# Patient Record
Sex: Female | Born: 1975 | Hispanic: Yes | Marital: Married | Smoking: Never smoker
Health system: Southern US, Community
[De-identification: ages and names within clinical notes are randomized; demographics above are authoritative.]

## PROBLEM LIST (undated history)

## (undated) DIAGNOSIS — E559 Vitamin D deficiency, unspecified: Secondary | ICD-10-CM

## (undated) HISTORY — DX: Vitamin D deficiency, unspecified: E55.9

---

## 2019-09-15 DIAGNOSIS — R071 Chest pain on breathing: Secondary | ICD-10-CM | POA: Diagnosis not present

## 2019-11-24 ENCOUNTER — Encounter: Payer: Self-pay | Admitting: Family Medicine

## 2019-11-24 ENCOUNTER — Ambulatory Visit (INDEPENDENT_AMBULATORY_CARE_PROVIDER_SITE_OTHER): Payer: BC Managed Care – PPO | Admitting: Family Medicine

## 2019-11-24 ENCOUNTER — Other Ambulatory Visit: Payer: Self-pay

## 2019-11-24 VITALS — BP 100/80 | HR 88 | Ht 60.0 in | Wt 143.4 lb

## 2019-11-24 DIAGNOSIS — E559 Vitamin D deficiency, unspecified: Secondary | ICD-10-CM

## 2019-11-24 DIAGNOSIS — Z1231 Encounter for screening mammogram for malignant neoplasm of breast: Secondary | ICD-10-CM

## 2019-11-24 DIAGNOSIS — E663 Overweight: Secondary | ICD-10-CM

## 2019-11-24 DIAGNOSIS — Z114 Encounter for screening for human immunodeficiency virus [HIV]: Secondary | ICD-10-CM | POA: Diagnosis not present

## 2019-11-24 DIAGNOSIS — E785 Hyperlipidemia, unspecified: Secondary | ICD-10-CM

## 2019-11-24 DIAGNOSIS — Z1159 Encounter for screening for other viral diseases: Secondary | ICD-10-CM | POA: Diagnosis not present

## 2019-11-24 DIAGNOSIS — Z87898 Personal history of other specified conditions: Secondary | ICD-10-CM

## 2019-11-24 DIAGNOSIS — Z7689 Persons encountering health services in other specified circumstances: Secondary | ICD-10-CM

## 2019-11-24 DIAGNOSIS — Z862 Personal history of diseases of the blood and blood-forming organs and certain disorders involving the immune mechanism: Secondary | ICD-10-CM

## 2019-11-24 DIAGNOSIS — Z23 Encounter for immunization: Secondary | ICD-10-CM

## 2019-11-24 DIAGNOSIS — E1169 Type 2 diabetes mellitus with other specified complication: Secondary | ICD-10-CM

## 2019-11-24 NOTE — Assessment & Plan Note (Signed)
Reportedly benign, removed 3 years ago.  Receives every 6 month mammograms, placed mammogram order.  Additionally will obtain records from previous PCP for further information.

## 2019-11-24 NOTE — Assessment & Plan Note (Signed)
Suspected history of anemia as told by previous PCP to start ferrous sulfate and B12 supplements.  Will obtain confirmatory CBC, ferritin, B12 levels today and start medication as appropriate.

## 2019-11-24 NOTE — Assessment & Plan Note (Signed)
Reviewed past medical, surgical, and social history.  Medications updated in epic.  Health maintenance reviewed and obtained HIV/hepatitis C screening.  Given flu shot today.  Encouraged working towards a well-balanced diet and staying physically active.

## 2019-11-24 NOTE — Patient Instructions (Signed)
Wonderful to meet you! Labs should be back in the next few days.   I encourage to keep following your wonderful diet and trying to stay active outside and at [facility].

## 2019-11-24 NOTE — Assessment & Plan Note (Signed)
Check vitamin D level.  Encouraged safe sun exposure and will discuss supplementation pending value.

## 2019-11-24 NOTE — Progress Notes (Signed)
    SUBJECTIVE:   CHIEF COMPLAINT / HPI: New patient  [name] is a 44 year old female presenting to establish care and discuss the following:  She has no concerns today with the exception of having a mammogram order placed.  Due to the breast lump found to 3 years ago, she gets a mammogram every 6 months.  Last in 06/2019 and was normal.  Past medical history: -- PCP told her to take B12, vitamin D, and ferrous sulfate during her last visit but she is not sure why, she has not started taking the supplements --Right breast lump about 3 years ago, reports had it removed 3 years ago and was benign  Past surgical history:  --Breast biopsy 3 years ago, reported benign  --Tubal ligation (all deliveries vaginal) --Breast reduction approximately 1 year ago  Social history: Recently moved here from [place] to be closer to family in June.  Lives with her husband and 4 sons.  Non-smoker. 1-2 beers on the weekend. Just joined the [employer].  Tries to follow extremely well-balanced diet with plenty of vegetables.  Does not work.  Medications: Omega 3 supplement as she was told it was good for her cholesterol, would like this checked today  Past family history: Denies any specific family history of early coronary artery disease/MI, CVA, or early death.  No family history of breast or colon cancer.  Health maintenance: Hepatitis C screening, HIV screening, Tdap,  Flu shot.  Had her Pap smear this year through OB/GYN back at previous home, reports normal.  OBJECTIVE:   BP 100/80   Pulse 88   Ht 5' (1.524 m)   Wt 143 lb 6.4 oz (65 kg)   LMP 10/14/2019 (Exact Date)   SpO2 99%   BMI 28.01 kg/m   General: Alert, NAD HEENT: NCAT, MMM Cardiac: RRR no m/g/r Lungs: Clear bilaterally, no increased WOB  Abdomen: soft, non-tender Msk: Moves all extremities spontaneously  Ext: Warm, dry, 2+ distal pulses  ASSESSMENT/PLAN:   Establishing care with new doctor, encounter for Reviewed past medical,  surgical, and social history.  Medications updated in epic.  Health maintenance reviewed and obtained HIV/hepatitis C screening.  Given flu shot today.  Encouraged working towards a well-balanced diet and staying physically active.  Hx of breast lump Reportedly benign, removed 3 years ago.  Receives every 6 month mammograms, placed mammogram order.  Additionally will obtain records from previous PCP for further information.  History of anemia Suspected history of anemia as told by previous PCP to start ferrous sulfate and B12 supplements.  Will obtain confirmatory CBC, ferritin, B12 levels today and start medication as appropriate.  Vitamin D deficiency Check vitamin D level.  Encouraged safe sun exposure and will discuss supplementation pending value.    Follow-up pending results above.   Diana Stack, DO [facility]

## 2019-11-25 ENCOUNTER — Other Ambulatory Visit: Payer: Self-pay | Admitting: Family Medicine

## 2019-11-25 DIAGNOSIS — E559 Vitamin D deficiency, unspecified: Secondary | ICD-10-CM

## 2019-11-25 LAB — CBC WITH DIFFERENTIAL/PLATELET
Basophils Absolute: 0 10*3/uL (ref 0.0–0.2)
Basos: 1 %
EOS (ABSOLUTE): 0.1 10*3/uL (ref 0.0–0.4)
Eos: 1 %
Hematocrit: 37.7 % (ref 34.0–46.6)
Hemoglobin: 12.5 g/dL (ref 11.1–15.9)
Immature Grans (Abs): 0 10*3/uL (ref 0.0–0.1)
Immature Granulocytes: 0 %
Lymphocytes Absolute: 2 10*3/uL (ref 0.7–3.1)
Lymphs: 31 %
MCH: 31.6 pg (ref 26.6–33.0)
MCHC: 33.2 g/dL (ref 31.5–35.7)
MCV: 95 fL (ref 79–97)
Monocytes Absolute: 0.4 10*3/uL (ref 0.1–0.9)
Monocytes: 7 %
Neutrophils Absolute: 3.9 10*3/uL (ref 1.4–7.0)
Neutrophils: 60 %
Platelets: 347 10*3/uL (ref 150–450)
RBC: 3.96 x10E6/uL (ref 3.77–5.28)
RDW: 13.3 % (ref 11.7–15.4)
WBC: 6.4 10*3/uL (ref 3.4–10.8)

## 2019-11-25 LAB — FERRITIN: Ferritin: 32 ng/mL (ref 15–150)

## 2019-11-25 LAB — VITAMIN D 25 HYDROXY (VIT D DEFICIENCY, FRACTURES): Vit D, 25-Hydroxy: 25.5 ng/mL — ABNORMAL LOW (ref 30.0–100.0)

## 2019-11-25 LAB — VITAMIN B12: Vitamin B-12: 422 pg/mL (ref 232–1245)

## 2019-11-25 LAB — HCV INTERPRETATION

## 2019-11-25 LAB — LIPID PANEL
Chol/HDL Ratio: 6.3 ratio — ABNORMAL HIGH (ref 0.0–4.4)
Cholesterol, Total: 219 mg/dL — ABNORMAL HIGH (ref 100–199)
HDL: 35 mg/dL — ABNORMAL LOW (ref >39)
LDL Chol Calc (NIH): 149 mg/dL — ABNORMAL HIGH (ref 0–99)
Triglycerides: 194 mg/dL — ABNORMAL HIGH (ref 0–149)
VLDL Cholesterol Cal: 35 mg/dL (ref 5–40)

## 2019-11-25 LAB — HIV ANTIBODY (ROUTINE TESTING W REFLEX): HIV Screen 4th Generation wRfx: NONREACTIVE

## 2019-11-25 LAB — HCV AB W REFLEX TO QUANT PCR: HCV Ab: <0.1 s/co ratio (ref 0.0–0.9)

## 2019-11-25 MED ORDER — VITAMIN D3 20 MCG (800 UNIT) PO TABS: 1.0000 | ORAL_TABLET | Freq: Every day | ORAL | 1 refills | Status: AC

## 2019-11-29 ENCOUNTER — Other Ambulatory Visit: Payer: Self-pay

## 2019-11-29 ENCOUNTER — Other Ambulatory Visit: Payer: Self-pay | Admitting: Family Medicine

## 2019-11-29 ENCOUNTER — Ambulatory Visit
Admission: RE | Admit: 2019-11-29 | Discharge: 2019-11-29 | Disposition: A | Discharge status: Home / Self Care | Payer: BC Managed Care – PPO | Source: Ambulatory Visit | Attending: Family Medicine | Admitting: Family Medicine

## 2019-11-29 DIAGNOSIS — N63 Unspecified lump in unspecified breast: Secondary | ICD-10-CM

## 2019-11-29 DIAGNOSIS — Z1231 Encounter for screening mammogram for malignant neoplasm of breast: Secondary | ICD-10-CM

## 2019-12-13 ENCOUNTER — Other Ambulatory Visit: Payer: Self-pay | Admitting: Family Medicine

## 2019-12-13 DIAGNOSIS — N63 Unspecified lump in unspecified breast: Secondary | ICD-10-CM

## 2019-12-20 ENCOUNTER — Other Ambulatory Visit: Payer: Self-pay

## 2019-12-20 ENCOUNTER — Ambulatory Visit: Payer: BC Managed Care – PPO | Admitting: Student in an Organized Health Care Education/Training Program

## 2019-12-20 DIAGNOSIS — L700 Acne vulgaris: Secondary | ICD-10-CM | POA: Diagnosis not present

## 2019-12-20 MED ORDER — CLINDAMYCIN PHOSPHATE 1 % EX SOLN: Freq: Two times a day (BID) | CUTANEOUS | 1 refills | Status: DC

## 2019-12-20 NOTE — Progress Notes (Signed)
    SUBJECTIVE:   CHIEF COMPLAINT / HPI: mod-severe acne vulgaris  chronic acne- currently having a flare which is improving. Two days ago had very large superficial comedone which was red and painful and has now drained. Continues to have other smaller closed comedones which are still red and painful but not as severe. Saw dermatologist previously for this problem and was prescribed retin-A which helped. She has run out of this. Not using OTC treatments at this time. Denies fever.   OBJECTIVE:   BP 106/72   Pulse 73   Wt 139 lb 12.8 oz (63.4 kg)   SpO2 98%   BMI 27.30 kg/m   General: NAD, pleasant, able to participate in exam Extremities: no edema. WWP. Skin: warm and dry Positive for moderate to severe cystic acne worse in mask-wearing distribution. No acute abscess or cellulitis.  Neuro: alert and oriented, no focal deficits Psych: Normal affect and mood  ASSESSMENT/PLAN:   Acne vulgaris Topical clindamycin BID until acute flare has resolved.  Patient can use as prophylaxis or as treatment for flares. Can also use in combination with other OTC treatments such as benzoyl peroxide or salicylic acid. - follow up with dermatology as needed.     Leeroy Bock, DO [facility]

## 2019-12-20 NOTE — Patient Instructions (Signed)
It was a pleasure to see you today!  To summarize our discussion for this visit:  I have prescribed a topical antibiotic to treat your acne. You can use it for flare ups or daily to help prevent reoccurrence.   Use Vaseline on arms and legs after bathing to help keep skin moist.   Some additional health maintenance measures we should update are: Health Maintenance Due  Topic Date Due  . URINE MICROALBUMIN  Never done  . COVID-19 Vaccine (1) Never done  . TETANUS/TDAP  Never done  . PAP SMEAR-Modifier  Never done  .   Please return to our clinic to see me as needed.  Call the clinic at [phone] if your symptoms worsen or you have any concerns.   Thank you for allowing me to take part in your care,  Dr. Jamelle Rushing

## 2019-12-21 NOTE — Assessment & Plan Note (Signed)
Topical clindamycin BID until acute flare has resolved.  Patient can use as prophylaxis or as treatment for flares. Can also use in combination with other OTC treatments such as benzoyl peroxide or salicylic acid. - follow up with dermatology as needed.

## 2020-01-24 ENCOUNTER — Ambulatory Visit
Admission: RE | Admit: 2020-01-24 | Discharge: 2020-01-24 | Disposition: A | Discharge status: Home / Self Care | Payer: BC Managed Care – PPO | Source: Ambulatory Visit | Attending: Family Medicine | Admitting: Family Medicine

## 2020-01-24 ENCOUNTER — Other Ambulatory Visit: Payer: Self-pay

## 2020-01-24 ENCOUNTER — Other Ambulatory Visit: Payer: Self-pay | Admitting: Family Medicine

## 2020-01-24 DIAGNOSIS — N63 Unspecified lump in unspecified breast: Secondary | ICD-10-CM

## 2020-01-24 DIAGNOSIS — N6311 Unspecified lump in the right breast, upper outer quadrant: Secondary | ICD-10-CM | POA: Diagnosis not present

## 2020-01-24 DIAGNOSIS — N6322 Unspecified lump in the left breast, upper inner quadrant: Secondary | ICD-10-CM | POA: Diagnosis not present

## 2020-01-24 DIAGNOSIS — N6314 Unspecified lump in the right breast, lower inner quadrant: Secondary | ICD-10-CM | POA: Diagnosis not present

## 2020-01-24 DIAGNOSIS — N6312 Unspecified lump in the right breast, upper inner quadrant: Secondary | ICD-10-CM | POA: Diagnosis not present

## 2020-02-21 ENCOUNTER — Other Ambulatory Visit: Payer: Self-pay | Admitting: Student in an Organized Health Care Education/Training Program

## 2020-07-19 ENCOUNTER — Other Ambulatory Visit: Payer: Self-pay

## 2020-07-20 ENCOUNTER — Ambulatory Visit (INDEPENDENT_AMBULATORY_CARE_PROVIDER_SITE_OTHER): Payer: BC Managed Care – PPO | Admitting: Family Medicine

## 2020-07-20 ENCOUNTER — Encounter: Payer: Self-pay | Admitting: Family Medicine

## 2020-07-20 ENCOUNTER — Other Ambulatory Visit: Payer: Self-pay

## 2020-07-20 VITALS — BP 95/70 | HR 70 | Ht 60.0 in | Wt 138.8 lb

## 2020-07-20 DIAGNOSIS — Z23 Encounter for immunization: Secondary | ICD-10-CM | POA: Diagnosis not present

## 2020-07-20 DIAGNOSIS — Z131 Encounter for screening for diabetes mellitus: Secondary | ICD-10-CM | POA: Diagnosis not present

## 2020-07-20 DIAGNOSIS — E559 Vitamin D deficiency, unspecified: Secondary | ICD-10-CM | POA: Diagnosis not present

## 2020-07-20 DIAGNOSIS — E785 Hyperlipidemia, unspecified: Secondary | ICD-10-CM | POA: Diagnosis not present

## 2020-07-20 DIAGNOSIS — Z0000 Encounter for general adult medical examination without abnormal findings: Secondary | ICD-10-CM

## 2020-07-20 LAB — POCT GLYCOSYLATED HEMOGLOBIN (HGB A1C): Hemoglobin A1C: 5.2 % (ref 4.0–5.6)

## 2020-07-20 NOTE — Assessment & Plan Note (Addendum)
Reviewed past medical and social history.  Medications updated in epic.  Health maintenance reviewed and will obtain lipid panel/screening A1c.  Declines STD screening, low risk.  Administered Tdap today.  Tubal ligation for contraception.  Next mammogram on 6/14, history of nonmalignant breast lump.  Congratulated and encouraged to continue working towards a well-balanced diet and staying physically active.

## 2020-07-20 NOTE — Patient Instructions (Signed)
Wonderful to see you!   Keep up the good work with your diet and exercise. Trying adding some fast walking to your routine.   Labs should be back in a few days, I will call or send a letter with results.

## 2020-07-20 NOTE — Assessment & Plan Note (Signed)
Recheck vitamin D level.  Taking 800 U daily.

## 2020-07-20 NOTE — Progress Notes (Signed)
    SUBJECTIVE:   Chief compliant/HPI: annual examination  Diana Fletcher is a 45 y.o. who presents today for an annual exam.  She reports she is doing well and has no concerns today.  She is due for her Tdap vaccine.  Otherwise up-to-date on health maintenance, has upcoming mammogram on 6/14.  Pap smear normal 2 years ago.  Lifelong non-smoker.  Sexually active with husband, history of tubal ligation, does not desire STD screening.  Follows balanced diet and walks several days a week for at least 30 minutes.  No family history of colon cancer.  History tabs reviewed and updated yes.   Review of systems form reviewed and notable for none.    OBJECTIVE:   BP 95/70   Pulse 70   Ht 5' (1.524 m)   Wt 138 lb 12.8 oz (63 kg)   LMP 07/13/2020 (Exact Date)   SpO2 98%   BMI 27.11 kg/m    General: Alert, NAD HEENT: NCAT, MMM, thyroid non-enlarged  Cardiac: RRR no m/g/r Lungs: Clear bilaterally, no increased WOB  Abdomen: soft Msk: Moves all extremities spontaneously, normal gait   Ext: Warm, dry, 2+ distal pulses, no edema   Psych: Normal mood and affect   ASSESSMENT/PLAN:   Annual physical exam Reviewed past medical and social history.  Medications updated in epic.  Health maintenance reviewed and will obtain lipid panel/screening A1c.  Declines STD screening, low risk.  Administered Tdap today.  Tubal ligation for contraception.  Next mammogram on 6/14, history of nonmalignant breast lump.  Congratulated and encouraged to continue working towards a well-balanced diet and staying physically active.  Vitamin D deficiency Recheck vitamin D level.  Taking 800 U daily.    Follow up in 1 year or sooner if indicated.    Allayne Stack, DO [facility]

## 2020-07-21 LAB — LIPID PANEL
Chol/HDL Ratio: 5.6 ratio — ABNORMAL HIGH (ref 0.0–4.4)
Cholesterol, Total: 230 mg/dL — ABNORMAL HIGH (ref 100–199)
HDL: 41 mg/dL (ref >39)
LDL Chol Calc (NIH): 142 mg/dL — ABNORMAL HIGH (ref 0–99)
Triglycerides: 259 mg/dL — ABNORMAL HIGH (ref 0–149)
VLDL Cholesterol Cal: 47 mg/dL — ABNORMAL HIGH (ref 5–40)

## 2020-07-21 LAB — VITAMIN D 25 HYDROXY (VIT D DEFICIENCY, FRACTURES): Vit D, 25-Hydroxy: 27 ng/mL — ABNORMAL LOW (ref 30.0–100.0)

## 2020-07-23 ENCOUNTER — Other Ambulatory Visit: Payer: Self-pay | Admitting: Family Medicine

## 2020-07-23 DIAGNOSIS — N63 Unspecified lump in unspecified breast: Secondary | ICD-10-CM

## 2020-07-24 ENCOUNTER — Other Ambulatory Visit: Payer: Self-pay | Admitting: Family Medicine

## 2020-07-24 ENCOUNTER — Other Ambulatory Visit: Payer: Self-pay

## 2020-07-24 ENCOUNTER — Ambulatory Visit
Admission: RE | Admit: 2020-07-24 | Discharge: 2020-07-24 | Disposition: A | Discharge status: Home / Self Care | Payer: BC Managed Care – PPO | Source: Ambulatory Visit | Attending: Family Medicine | Admitting: Family Medicine

## 2020-07-24 DIAGNOSIS — N6311 Unspecified lump in the right breast, upper outer quadrant: Secondary | ICD-10-CM | POA: Diagnosis not present

## 2020-07-24 DIAGNOSIS — N63 Unspecified lump in unspecified breast: Secondary | ICD-10-CM

## 2020-07-24 DIAGNOSIS — R922 Inconclusive mammogram: Secondary | ICD-10-CM | POA: Diagnosis not present

## 2020-07-24 DIAGNOSIS — N6489 Other specified disorders of breast: Secondary | ICD-10-CM | POA: Diagnosis not present

## 2020-08-31 ENCOUNTER — Ambulatory Visit (HOSPITAL_COMMUNITY)
Admission: EM | Admit: 2020-08-31 | Discharge: 2020-08-31 | Disposition: A | Discharge status: Home / Self Care | Payer: BC Managed Care – PPO

## 2020-08-31 ENCOUNTER — Other Ambulatory Visit: Payer: Self-pay

## 2020-09-17 DIAGNOSIS — K5900 Constipation, unspecified: Secondary | ICD-10-CM | POA: Diagnosis not present

## 2020-09-17 DIAGNOSIS — R109 Unspecified abdominal pain: Secondary | ICD-10-CM | POA: Diagnosis not present

## 2020-09-17 DIAGNOSIS — N390 Urinary tract infection, site not specified: Secondary | ICD-10-CM | POA: Diagnosis not present

## 2021-06-17 ENCOUNTER — Other Ambulatory Visit: Payer: Self-pay | Admitting: Family Medicine

## 2021-06-17 DIAGNOSIS — N6320 Unspecified lump in the left breast, unspecified quadrant: Secondary | ICD-10-CM

## 2021-07-04 DIAGNOSIS — Z03818 Encounter for observation for suspected exposure to other biological agents ruled out: Secondary | ICD-10-CM | POA: Diagnosis not present

## 2021-07-04 DIAGNOSIS — Z20822 Contact with and (suspected) exposure to covid-19: Secondary | ICD-10-CM | POA: Diagnosis not present

## 2021-07-16 ENCOUNTER — Emergency Department (HOSPITAL_COMMUNITY): Payer: BC Managed Care – PPO

## 2021-07-16 ENCOUNTER — Emergency Department (HOSPITAL_COMMUNITY)
Admission: EM | Admit: 2021-07-16 | Discharge: 2021-07-16 | Disposition: A | Discharge status: Home / Self Care | Payer: BC Managed Care – PPO | Attending: Student | Admitting: Student

## 2021-07-16 ENCOUNTER — Encounter (HOSPITAL_COMMUNITY): Payer: Self-pay

## 2021-07-16 ENCOUNTER — Other Ambulatory Visit: Payer: Self-pay

## 2021-07-16 DIAGNOSIS — S43422A Sprain of left rotator cuff capsule, initial encounter: Secondary | ICD-10-CM | POA: Diagnosis not present

## 2021-07-16 DIAGNOSIS — M25512 Pain in left shoulder: Secondary | ICD-10-CM | POA: Diagnosis not present

## 2021-07-16 DIAGNOSIS — W109XXA Fall (on) (from) unspecified stairs and steps, initial encounter: Secondary | ICD-10-CM | POA: Insufficient documentation

## 2021-07-16 DIAGNOSIS — S4992XA Unspecified injury of left shoulder and upper arm, initial encounter: Secondary | ICD-10-CM | POA: Diagnosis not present

## 2021-07-16 DIAGNOSIS — S2242XA Multiple fractures of ribs, left side, initial encounter for closed fracture: Secondary | ICD-10-CM | POA: Diagnosis not present

## 2021-07-16 DIAGNOSIS — W19XXXA Unspecified fall, initial encounter: Secondary | ICD-10-CM

## 2021-07-16 MED ORDER — NAPROXEN 375 MG PO TABS: 375.0000 mg | ORAL_TABLET | Freq: Two times a day (BID) | ORAL | 0 refills | Status: DC

## 2021-07-16 MED ORDER — LIDOCAINE 5 % EX PTCH
1.0000 | MEDICATED_PATCH | CUTANEOUS | Status: DC
  Administered 2021-07-16: 1 via TRANSDERMAL
  Filled 2021-07-16: qty 1

## 2021-07-16 MED ORDER — NAPROXEN 250 MG PO TABS
500.0000 mg | ORAL_TABLET | Freq: Once | ORAL | Status: AC
  Administered 2021-07-16: 500 mg via ORAL
  Filled 2021-07-16: qty 2

## 2021-07-16 NOTE — ED Notes (Signed)
Left prior to vital signs.

## 2021-07-16 NOTE — ED Triage Notes (Signed)
Pt arrived POV from home c/o a fall last night. Pt states she was coming down the stairs and fell down about 3-4 stairs. Pt states her slippers were too slick and she slipped. Pt is c/o left shoulder pain and upper back pain on the left side.

## 2021-07-16 NOTE — ED Provider Notes (Signed)
[facility]  CSN: [id number] Arrival date & time: 07/16/21 1528  Chief Complaint(s) Fall  HPI Diana Fletcher is a [age] y.o. female who presents emergency department for evaluation of a fall.  Patient states that she slipped on the stairs fell onto her left shoulder.  No head strike or loss of consciousness.  She denies numbness, tingling, weakness of the upper or lower extremities.  Denies headache, nausea, vomiting or other systemic symptoms.  She endorses pain to the left shoulder and left scapula.  No external evidence of trauma on initial exam.   Past Medical History History reviewed. No pertinent past medical history. Patient Active Problem List   Diagnosis Date Noted   Annual physical exam 07/20/2020   Acne vulgaris 12/20/2019   Hx of breast lump 11/24/2019   History of anemia 11/24/2019   Vitamin D deficiency 11/24/2019   Home Medication(s) Prior to Admission medications   Medication Sig Start Date End Date Taking? Authorizing Provider  naproxen (NAPROSYN) 375 MG tablet Take 1 tablet (375 mg total) by mouth 2 (two) times daily. 07/16/21  Yes Devony Mcgrady, MD  Cholecalciferol (VITAMIN D3) 20 MCG (800 UNIT) TABS Take 1 tablet by mouth daily. 11/25/19   Allayne StackBeard, Samantha N, DO                                                                                                                                    Past Surgical History History reviewed. No pertinent surgical history. Family History History reviewed. No pertinent family history.  Social History   Allergies Patient has no known allergies.  Review of Systems Review of Systems  Musculoskeletal:  Positive for arthralgias and myalgias.   Physical Exam Vital Signs  I have reviewed the triage vital signs BP 111/84 (BP Location: Right Arm)   Pulse 91   Temp 98.3 F (36.8 C) (Oral)   Resp 16   Ht 5' (1.524 m)   Wt 62.6 kg   SpO2 98%   BMI 26.95 kg/m    Physical Exam Vitals and nursing note reviewed.  Constitutional:      General: She is not in acute distress.    Appearance: She is well-developed.  HENT:     Head: Normocephalic and atraumatic.  Eyes:     Conjunctiva/sclera: Conjunctivae normal.  Cardiovascular:     Rate and Rhythm: Normal rate and regular rhythm.     Heart sounds: No murmur heard. Pulmonary:     Effort: Pulmonary effort is normal. No respiratory distress.     Breath sounds: Normal breath sounds.  Abdominal:     Palpations: Abdomen is soft.     Tenderness: There is no abdominal tenderness.  Musculoskeletal:        General: Tenderness (Left scapula, left shoulder) present. No swelling.     Cervical back: Neck supple.  Skin:    General: Skin is warm  and dry.     Capillary Refill: Capillary refill takes less than 2 seconds.  Neurological:     Mental Status: She is alert.  Psychiatric:        Mood and Affect: Mood normal.    ED Results and Treatments Labs (all labs ordered are listed, but only abnormal results are displayed) Labs Reviewed - No data to display                                                                                                                        Radiology DG Shoulder Left  Result Date: 07/16/2021 CLINICAL DATA:  Fall with left-sided pain. EXAM: LEFT SHOULDER - 2+ VIEW COMPARISON:  None Available. FINDINGS: There is no evidence of fracture or dislocation. Minimal degenerative change of the acromioclavicular joint. Soft tissues are unremarkable. There are left anterior third through fifth rib fractures have callus formation and appear removed. IMPRESSION: No acute fracture or subluxation of the left shoulder. Electronically Signed   By: Narda Rutherford M.D.   On: 07/16/2021 17:25    Pertinent labs & imaging results that were available during my care of the patient were reviewed by me and considered in my medical decision making (see MDM for details).  Medications Ordered in  ED Medications  lidocaine (LIDODERM) 5 % 1 patch (1 patch Transdermal Patch Applied 07/16/21 1825)  naproxen (NAPROSYN) tablet 500 mg (500 mg Oral Given 07/16/21 1825)                                                                                                                                     Procedures Procedures  (including critical care time)  Medical Decision Making / ED Course   This patient presents to the ED for concern of fall, left shoulder pain, this involves an extensive number of treatment options, and is a complaint that carries with it a high risk of complications and morbidity.  The differential diagnosis includes fracture, ligamentous injury, rotator cuff tear, polytrauma  MDM: Patient seen emergency room for evaluation of left shoulder pain after fall.  Physical exam with tenderness at the left scapula and tenderness against resistance with a empty soda can test.  X-ray imaging negative.  Suspect rotator cuff injury.  Patient has full range of motion of neck with no midline C-spine tenderness and she does not meet [facility] head CT criteria for CT imaging of the  head or C-spine.  Patient placed in a sling and given Naprosyn and Lidoderm patches for pain control.  Patient given information to follow-up with [facility].  Patient then discharged.   Additional history obtained: -Additional history obtained from husband -External records from outside source obtained and reviewed including: Chart review including previous notes, labs, imaging, consultation notes  Imaging Studies ordered: I ordered imaging studies including Shoulder XR I independently visualized and interpreted imaging. I agree with the radiologist interpretation   Medicines ordered and prescription drug management: Meds ordered this encounter  Medications   naproxen (NAPROSYN) tablet 500 mg   lidocaine (LIDODERM) 5 % 1 patch   naproxen (NAPROSYN) 375 MG tablet    Sig: Take 1 tablet (375 mg total) by  mouth 2 (two) times daily.    Dispense:  20 tablet    Refill:  0    -I have reviewed the patients home medicines and have made adjustments as needed  Critical interventions none   Reevaluation: After the interventions noted above, I reevaluated the patient and found that they have :improved  Co morbidities that complicate the patient evaluation History reviewed. No pertinent past medical history.    Dispostion: I considered admission for this patient, with negative trauma imaging she is safe for discharge with outpatient follow-up.  She does not meet inpatient criteria for admission.     Final Clinical Impression(s) / ED Diagnoses Final diagnoses:  Fall, initial encounter  Sprain of left rotator cuff capsule, initial encounter     @PCDICTATION @    , MD 07/16/21 1907

## 2021-07-16 NOTE — ED Provider Triage Note (Signed)
Emergency Medicine Provider Triage Evaluation Note  Diana Fletcher , a 46 y.o. female  was evaluated in triage.  Pt complains of left shoulder pain after fall.  Patient states that she was walking in her slippers and slipped going down the stairs landing directly on her left shoulder around 10:30 PM last night.  Denies weakness, sensory deficits, dizziness/lightheadedness before the fall, fever, trauma to head, blood thinner use.  Review of Systems  Positive: See above Negative:   Physical Exam  BP 111/84 (BP Location: Right Arm)   Pulse 91   Temp 98.3 F (36.8 C) (Oral)   Resp 16   Ht 5' (1.524 m)   Wt 62.6 kg   SpO2 98%   BMI 26.95 kg/m  Gen:   Awake, no distress   Resp:  Normal effort  MSK:   Moves extremities without difficulty.  Patient has full passive range of motion of left shoulder.  Muscular tenderness upon palpation.  No tenderness to humeral head, clavicle, scapula.  No midline C-spine tenderness.  Radial pulses full and intact bilaterally.  Strength 5 out of 5. Other:    Medical Decision Making  Medically screening exam initiated at 4:34 PM.  Appropriate orders placed.  Diana Fletcher was informed that the remainder of the evaluation will be completed by another provider, this initial triage assessment does not replace that evaluation, and the importance of remaining in the ED until their evaluation is complete.     Wilnette Kales, [place] 07/16/21 [phone]

## 2021-08-03 NOTE — Progress Notes (Signed)
    SUBJECTIVE:   Chief compliant/HPI: annual examination  Diana Fletcher is a 46 y.o. female who presents today for an annual exam.   Current concerns: L shoulder pain- s/p fall 3 weeks ago. Was seen in ED and diagnosed with rotator cuff injury. Prescribed Naproxen but never picked it up. Still having pain and difficulty raising arm above 90 degrees, although symptoms are improved from initial injury.  History tabs reviewed and updated.  Exercises regularly- walks 30-40 minutes daily Watches her diet- tries to limit carbs   Review of systems form reviewed and no concerns noted.  OBJECTIVE:   BP 111/82   Pulse 99   Wt 139 lb (63 kg)   LMP 07/19/2021 (Approximate)   SpO2 99%   BMI 27.15 kg/m   Gen: NAD, pleasant, able to participate in exam HEENT: PERRLA, TM normal bilaterally, oropharynx unremarkable CV: RRR, normal S1/S2, no murmur Resp: Normal effort, lungs CTAB GI: abdomen soft, non-tender, non-distended Skin: warm and dry, no rashes noted Neuro: alert, no obvious focal deficits Psych: Normal affect and mood GU/GYN: Exam performed in the presence of a chaperone. External genitalia within normal limits.  Vaginal mucosa pink, moist, normal rugae.  Nonfriable cervix without lesions, no discharge or bleeding noted on speculum exam.  Bimanual exam revealed normal, nongravid uterus.  No cervical motion tenderness. No adnexal masses bilaterally.    Shoulder: Inspection reveals no obvious deformity, atrophy, or asymmetry. No bruising. No swelling Palpation is normal with no TTP over [facility] joint or bicipital groove. Active abduction limited to ~60 degrees secondary to pain Normal ROM in internal/external rotation NV intact distally Special Tests:  - Supraspinatous: Positive empty can.  5/5 strength with resisted flexion at 20 degrees - Infraspinatous/Teres Minor: 5/5 strength with ER - Subscapularis: negative belly press, negative bear hug. 5/5 strength with IR - Biceps  tendon: Negative Speeds - AC Joint: Negative cross arm   ASSESSMENT/PLAN:   Left Shoulder Pain S/p fall 3 weeks ago. Presentation consistent with rotator cuff strain. New Rx sent for Naproxen 500mg  BID. Rehab exercises given. If no improvement in 2-3 weeks, consider sports med eval and ultrasound.  Vit D Deficiency -Check Vit D level today -Continue daily supplementation   Annual Examination  See AVS for age appropriate recommendations.   PHQ score 1, reviewed and discussed.  Blood pressure reviewed and at goal.  Asked about intimate partner violence and resources given as appropriate  The patient currently uses tubal ligation for contraception.   Considered the following items based upon USPSTF recommendations: Diabetes screening: discussed Screening for elevated cholesterol: ordered HIV testing:  previously negative. Repeat not indicated Hepatitis C: previously negative. Repeat not indicated Syphilis if at high risk:  not at high risk GC/CT not at high risk and not ordered. Reviewed risk factors for latent tuberculosis and not indicated Reviewed risk factors for osteoporosis. Using FRAX tool estimated risk of major osteoporotic fracture of  2.1%, early screening not ordered  Cervical cancer screening: due for Pap today, cytology + HPV ordered Breast cancer screening:  had mammogram and ultrasound yesterday- showed stable probable benign masses in L breast (BI-RADS 3) Colorectal cancer screening: not applicable given age.   Follow up in 1 year or sooner if indicated.    , MD [facility]

## 2021-08-05 ENCOUNTER — Ambulatory Visit
Admission: RE | Admit: 2021-08-05 | Discharge: 2021-08-05 | Disposition: A | Discharge status: Home / Self Care | Payer: BC Managed Care – PPO | Source: Ambulatory Visit | Attending: Podiatry | Admitting: Podiatry

## 2021-08-05 DIAGNOSIS — N6322 Unspecified lump in the left breast, upper inner quadrant: Secondary | ICD-10-CM | POA: Diagnosis not present

## 2021-08-05 DIAGNOSIS — N6320 Unspecified lump in the left breast, unspecified quadrant: Secondary | ICD-10-CM

## 2021-08-05 DIAGNOSIS — R922 Inconclusive mammogram: Secondary | ICD-10-CM | POA: Diagnosis not present

## 2021-08-06 ENCOUNTER — Other Ambulatory Visit: Payer: Self-pay

## 2021-08-06 ENCOUNTER — Ambulatory Visit: Payer: BC Managed Care – PPO | Admitting: Family Medicine

## 2021-08-06 ENCOUNTER — Other Ambulatory Visit (HOSPITAL_COMMUNITY)
Admission: RE | Admit: 2021-08-06 | Discharge: 2021-08-06 | Disposition: A | Discharge status: Home / Self Care | Payer: BC Managed Care – PPO | Source: Ambulatory Visit | Attending: Family Medicine | Admitting: Family Medicine

## 2021-08-06 VITALS — BP 111/82 | HR 99 | Wt 139.0 lb

## 2021-08-06 DIAGNOSIS — E559 Vitamin D deficiency, unspecified: Secondary | ICD-10-CM

## 2021-08-06 DIAGNOSIS — E785 Hyperlipidemia, unspecified: Secondary | ICD-10-CM | POA: Diagnosis not present

## 2021-08-06 DIAGNOSIS — Z0000 Encounter for general adult medical examination without abnormal findings: Secondary | ICD-10-CM | POA: Diagnosis not present

## 2021-08-06 DIAGNOSIS — Z124 Encounter for screening for malignant neoplasm of cervix: Secondary | ICD-10-CM

## 2021-08-06 DIAGNOSIS — M7542 Impingement syndrome of left shoulder: Secondary | ICD-10-CM

## 2021-08-06 MED ORDER — NAPROXEN 500 MG PO TABS: 500.0000 mg | ORAL_TABLET | Freq: Two times a day (BID) | ORAL | 0 refills | Status: AC

## 2021-08-07 ENCOUNTER — Encounter: Payer: Self-pay | Admitting: Family Medicine

## 2021-08-07 DIAGNOSIS — E785 Hyperlipidemia, unspecified: Secondary | ICD-10-CM | POA: Insufficient documentation

## 2021-08-07 LAB — VITAMIN D 25 HYDROXY (VIT D DEFICIENCY, FRACTURES): Vit D, 25-Hydroxy: 29.1 ng/mL — ABNORMAL LOW (ref 30.0–100.0)

## 2021-08-07 LAB — LIPID PANEL
Chol/HDL Ratio: 5 ratio — ABNORMAL HIGH (ref 0.0–4.4)
Cholesterol, Total: 221 mg/dL — ABNORMAL HIGH (ref 100–199)
HDL: 44 mg/dL (ref >39)
LDL Chol Calc (NIH): 153 mg/dL — ABNORMAL HIGH (ref 0–99)
Triglycerides: 132 mg/dL (ref 0–149)
VLDL Cholesterol Cal: 24 mg/dL (ref 5–40)

## 2021-08-07 LAB — CYTOLOGY - PAP
Adequacy: ABSENT
Comment: NEGATIVE
Diagnosis: NEGATIVE
High risk HPV: NEGATIVE

## 2021-08-09 ENCOUNTER — Telehealth: Payer: Self-pay

## 2021-08-09 NOTE — Telephone Encounter (Signed)
Returned call to discuss results from recent visit. Normal pap- repeat due in 5 years. Vit D slightly low- continue daily supplementation (1000 IU Vit D). Cholesterol high but no indication for statin- advised lifestyle modifications. All questions answered.  Maury Dus, MD PGY-2, [facility]

## 2021-08-09 NOTE — Telephone Encounter (Signed)
Patient calls nurse line requesting lab results.   Will forward to PCP.  

## 2021-08-14 ENCOUNTER — Ambulatory Visit
Admission: RE | Admit: 2021-08-14 | Discharge: 2021-08-14 | Disposition: A | Discharge status: Home / Self Care | Payer: BC Managed Care – PPO | Source: Ambulatory Visit | Attending: Family Medicine | Admitting: Family Medicine

## 2021-08-14 ENCOUNTER — Ambulatory Visit: Payer: Self-pay

## 2021-08-14 ENCOUNTER — Ambulatory Visit: Payer: BC Managed Care – PPO | Admitting: Family Medicine

## 2021-08-14 VITALS — BP 86/50 | Ht 60.0 in | Wt 140.0 lb

## 2021-08-14 DIAGNOSIS — M25512 Pain in left shoulder: Secondary | ICD-10-CM

## 2021-08-14 NOTE — Patient Instructions (Signed)
Get repeat x-rays after you leave today. If these look normal I would recommend an MRI of your shoulder to further characterize the injury to the bone, assess the rotator cuff more clearly. Stop the home exercises for now. Icing 15 minutes at a time 3-4 times a day. Tylenol, ibuprofen if needed.

## 2021-08-15 ENCOUNTER — Encounter: Payer: Self-pay | Admitting: Family Medicine

## 2021-08-15 NOTE — Progress Notes (Signed)
PCP: Maury Dus, MD  Subjective:   HPI: Patient is a 46 y.o. female here for left shoulder pain.  Patient reports on 6/5 she was coming down the stairs quickly and fell injuring her left shoulder. Unsure exactly how she fell. Did not pass out or have prodromal symptoms. Has been doing home exercises with theraband. + night pain. No prior injuries to this shoulder. She is right handed. Radiographs were negative.  Past Medical History:  Diagnosis Date   Vitamin D deficiency     Current Outpatient Medications on File Prior to Visit  Medication Sig Dispense Refill   Cholecalciferol (VITAMIN D3) 20 MCG (800 UNIT) TABS Take 1 tablet by mouth daily. 90 tablet 1   naproxen (NAPROSYN) 500 MG tablet Take 1 tablet (500 mg total) by mouth 2 (two) times daily with a meal. 30 tablet 0   No current facility-administered medications on file prior to visit.    History reviewed. No pertinent surgical history.  No Known Allergies  BP (!) 86/50   Ht 5' (1.524 m)   Wt 140 lb (63.5 kg)   LMP 07/19/2021 (Approximate)   BMI 27.34 kg/m       No data to display              No data to display              Objective:  Physical Exam:  Gen: NAD, comfortable in exam room  Left shoulder: No swelling, ecchymoses.  No gross deformity. No TTP AC joint, biceps tendon. Full IR and ER.  Abduction and flexion limited to 90 degrees. Positive Hawkins, Neers. Negative Yergasons. Strength 4/5 with empty can and 5/5 resisted internal/external rotation. Negative apprehension. NV intact distally.   Complete MSK u/s left shoulder: Biceps tendon: intact on long and short views Pec major tendon: intact Subscapularis: intact without abnormalities AC joint: minimal effusion.  No other abnormalities Infraspinatus: intact without tear Supraspinatus: intact with mild overlying bursitis.  Cortical irregularity in two portions of greater tuberosity concerning for nondisplaced  fracture Posterior glenohumeral joint: no effusion.  Visible labrum intact.  No paralabral cyst  Impression: Probable nondisplaced fracture of greater tuberosity.  Rotator cuff intact.  Assessment & Plan:  1. Left shoulder injury - 2/2 fall.  1 month out from injury.  Ultrasound concerning for nondisplaced greater tuberosity fracture while rotator cuff is intact.  Will repeat radiographs to assess.  Consider MRI if those are negative.  Advised to stop strengthening exercises.

## 2021-09-03 ENCOUNTER — Ambulatory Visit: Payer: BC Managed Care – PPO | Admitting: Family Medicine

## 2021-09-03 DIAGNOSIS — L731 Pseudofolliculitis barbae: Secondary | ICD-10-CM | POA: Diagnosis not present

## 2021-09-03 NOTE — Patient Instructions (Signed)
Thank you for coming to see me today. It was a pleasure. Today we discussed your lump, it is small and I do not think it is anything dangerous, probably an in growing hair. I recommend: avoid shaving and waxing, trying laser, warm compresses, exfoliate with area   Please follow-up with [place] if it gets larger, fevers, drainage/bleeding from the area   If you have any questions or concerns, please do not hesitate to call the office at [phone].  Best wishes,   Dr Allena Katz

## 2021-09-03 NOTE — Assessment & Plan Note (Addendum)
Lump is likely an ingrown hair. It is too small to be an abscess, in addition no erythema, drainage or bleeding.  Low suspicion for lymphadenopathy. Recommended: -Avoid shaving and waxing -Patient can try laser hair removal for permanent removal of hair -Warm compresses multiple times a day -Exfoliating and moisturizing armpit area  -If increases in size then patient should follow-up with PCP

## 2021-09-03 NOTE — Progress Notes (Signed)
     SUBJECTIVE:   CHIEF COMPLAINT / HPI:   Diana Fletcher is a 46 y.o. female presents for follow-up  Left axillary lump Patient noticed extremely lump several months ago. She felt it when she was shaving/waxing at home. It is not getting bigger.  It hurts a little bit. Denies drainage, bleeding, fevers, night sweats, weight loss, etc. Denies breast lumps or nipple changes.    Flowsheet Row Office Visit from 08/06/2021 in [facility]  PHQ-9 Total Score 1         PERTINENT  PMH / PSH: Vitamin D deficiency, hyperlipidemia  OBJECTIVE:   BP 101/74   Pulse 82   Wt 137 lb 9.6 oz (62.4 kg)   LMP 07/19/2021 (Approximate)   SpO2 100%   BMI 26.87 kg/m    General: Alert, no acute distress Cardio: Well-perfused Pulm:  normal work of breathing Axilla: Deep, firm left-sided axilla lump, approximately 0.5 cm, mild tenderness to palpation, no overlying erythema, no drainage Neuro: Cranial nerves grossly intact   ASSESSMENT/PLAN:   Ingrown hair Lump is likely an ingrown hair. It is too small to be an abscess, in addition no erythema, drainage or bleeding.  Low suspicion for lymphadenopathy. Recommended: -Avoid shaving and waxing -Patient can try laser hair removal for permanent removal of hair -Warm compresses multiple times a day -Exfoliating and moisturizing armpit area  -If increases in size then patient should follow-up with PCP    Towanda Octave, MD PGY-3 [facility]

## 2022-09-03 ENCOUNTER — Ambulatory Visit (INDEPENDENT_AMBULATORY_CARE_PROVIDER_SITE_OTHER): Payer: BC Managed Care – PPO | Admitting: Family Medicine

## 2022-09-03 ENCOUNTER — Encounter: Payer: Self-pay | Admitting: Family Medicine

## 2022-09-03 VITALS — BP 92/68 | HR 78 | Ht 60.0 in | Wt 135.0 lb

## 2022-09-03 DIAGNOSIS — E782 Mixed hyperlipidemia: Secondary | ICD-10-CM | POA: Diagnosis not present

## 2022-09-03 DIAGNOSIS — Z87898 Personal history of other specified conditions: Secondary | ICD-10-CM | POA: Diagnosis not present

## 2022-09-03 DIAGNOSIS — Z0000 Encounter for general adult medical examination without abnormal findings: Secondary | ICD-10-CM

## 2022-09-03 NOTE — Patient Instructions (Addendum)
It was wonderful to see you today.  Today we talked about:  Your general health.  Overall you are very healthy and should continue with your daily routines.  We discussed briefly healthy diet and exercise including making sure you are not getting fruits and vegetables with every meal.  We will do a lipid panel today to follow your high cholesterol but overall you are at low risk for any heart problems at this time.  We also discussed that you can stop taking your vitamin D supplement as that was intended to be a short-term supplement.  Colon cancer screening.  As discussed you have several options for colon cancer screening moving forward: Additional colonoscopy, Cologuard, and FIT testing.  A few days to look these up and decide which one you think will be the most beneficial for you, then give [place] a call so that we can order the appropriate test.  Breast mass follow up.  I have put in the order for your follow-up mammogram and left breast ultrasound.  You can call the number on the form that you were given today to schedule an appointment for that whenever it is most convenient for you.  Thank you for choosing [facility].   Please call [phone] with any questions about today's appointment.  Please arrive at least 15 minutes prior to your scheduled appointments.   If you had blood work today, I will send you a MyChart message or a letter if results are normal. Otherwise, I will give you a call.   If you had a referral placed, they will call you to set up an appointment. Please give [place] a call if you don't hear back in the next 2 weeks.   If you need additional refills before your next appointment, please call your pharmacy first.   Gerrit Heck, DO Family Medicine

## 2022-09-03 NOTE — Assessment & Plan Note (Signed)
Scheduled patient for annual diagnostic mammogram with left breast ultrasound for follow-up on previously seen left breast mass BI-RADS 3.  After discussion, patient would like to think about options for colonoscopy before scheduling.  He was advised that all she would need to do once she made her decision was give the office a call and we would send in the order.  We also ordered a lipid panel and to continue following her high cholesterol.  She is low risk for cardiac events with an ASCVD score of less than 1%, statin therapy is not recommended at this time.  Discussed healthy diet and exercise. Patient may follow-up as needed

## 2022-09-03 NOTE — Progress Notes (Signed)
    SUBJECTIVE:   CHIEF COMPLAINT / HPI:   47 yo female here for general wellness exam.  No current concerns.  Has hyperlipidemia, needs lipid panel. Risk for ASCVD based on last labs is 0.9%. PAP done 07/2021, was negative, not due again until 2026 at earliest.  Due for follow up mammogram and ultrasound of stable left breast mass, BIRADS 3  Due for colonoscopy. Discussed colonoscopy options, would like to have some time to think about what she would like to do.   PERTINENT  PMH / PSH: None  OBJECTIVE:   BP 92/68   Pulse 78   Ht 5' (1.524 m)   Wt 135 lb (61.2 kg)   LMP 07/21/2022   SpO2 99%   BMI 26.37 kg/m   General: A&O, NAD HEENT: No sign of trauma, EOM grossly intact Cardiac: RRR, no m/r/g Respiratory: CTAB, normal WOB, no w/c/r Extremities: NTTP, no peripheral edema. Neuro: Normal gait, moves all four extremities appropriately. Psych: Appropriate mood and affect   ASSESSMENT/PLAN:   Annual physical exam Scheduled patient for annual diagnostic mammogram with left breast ultrasound for follow-up on previously seen left breast mass BI-RADS 3.  After discussion, patient would like to think about options for colonoscopy before scheduling.  He was advised that all she would need to do once she made her decision was give the office a call and we would send in the order.  We also ordered a lipid panel and to continue following her high cholesterol.  She is low risk for cardiac events with an ASCVD score of less than 1%, statin therapy is not recommended at this time.  Discussed healthy diet and exercise. Patient may follow-up as needed     Gerrit Heck, DO [facility]

## 2022-09-04 ENCOUNTER — Encounter: Payer: Self-pay | Admitting: Family Medicine

## 2022-09-04 LAB — LIPID PANEL
Chol/HDL Ratio: 5.4 ratio — ABNORMAL HIGH (ref 0.0–4.4)
Cholesterol, Total: 258 mg/dL — ABNORMAL HIGH (ref 100–199)
HDL: 48 mg/dL (ref >39)
LDL Chol Calc (NIH): 173 mg/dL — ABNORMAL HIGH (ref 0–99)
Triglycerides: 198 mg/dL — ABNORMAL HIGH (ref 0–149)
VLDL Cholesterol Cal: 37 mg/dL (ref 5–40)

## 2022-09-10 ENCOUNTER — Ambulatory Visit
Admission: RE | Admit: 2022-09-10 | Discharge: 2022-09-10 | Disposition: A | Discharge status: Home / Self Care | Payer: BC Managed Care – PPO | Source: Ambulatory Visit | Attending: Family Medicine | Admitting: Family Medicine

## 2022-09-10 DIAGNOSIS — Z87898 Personal history of other specified conditions: Secondary | ICD-10-CM

## 2022-09-10 DIAGNOSIS — N6322 Unspecified lump in the left breast, upper inner quadrant: Secondary | ICD-10-CM | POA: Diagnosis not present

## 2023-03-09 IMAGING — CR DG SHOULDER 2+V*L*
4 series · 4 of 4 positions shown · non-contrast
Comparison: None Available.

CLINICAL DATA: Fall with left-sided pain.

EXAM:
LEFT SHOULDER - 2+ VIEW

[shoulder grashey]
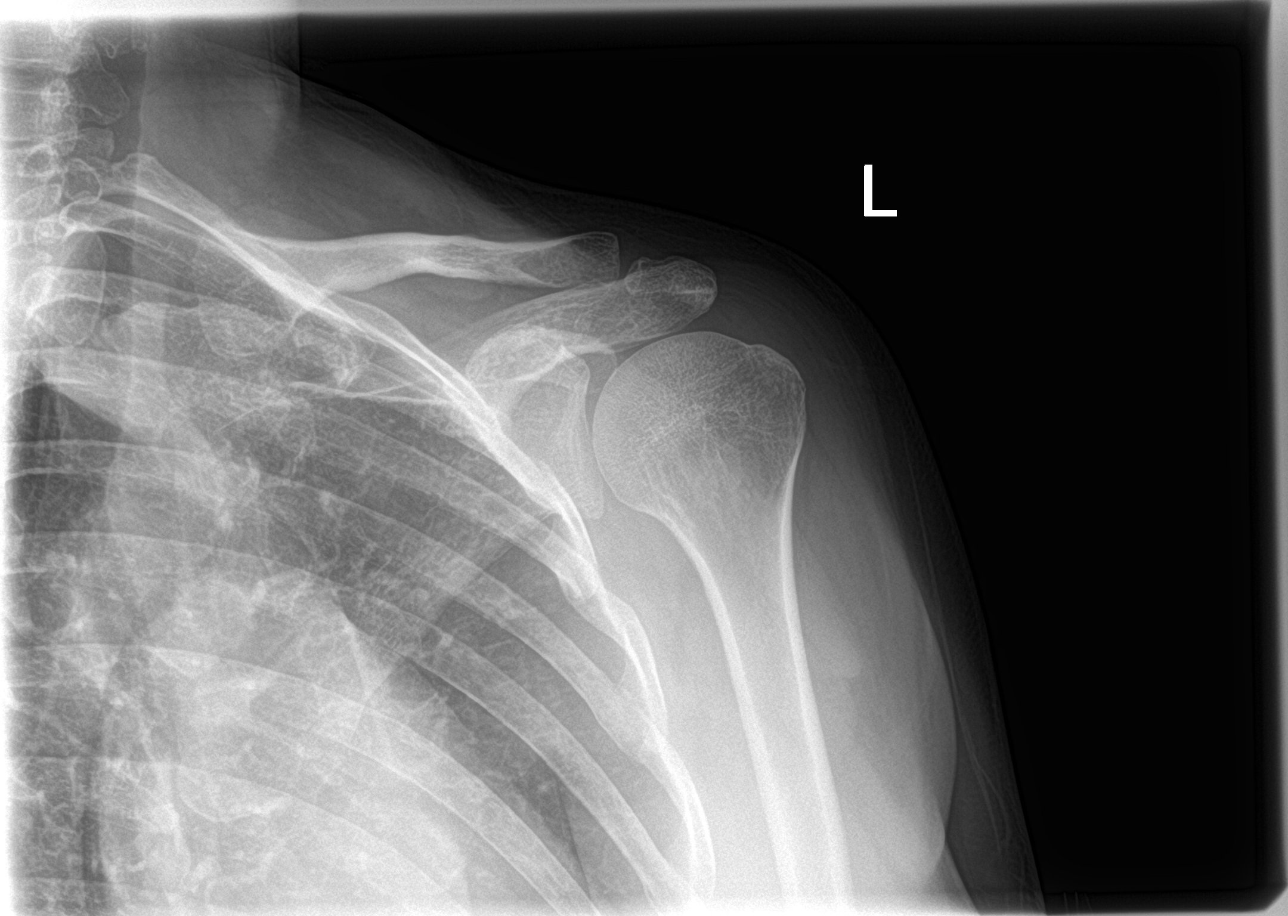

[shoulder y view]
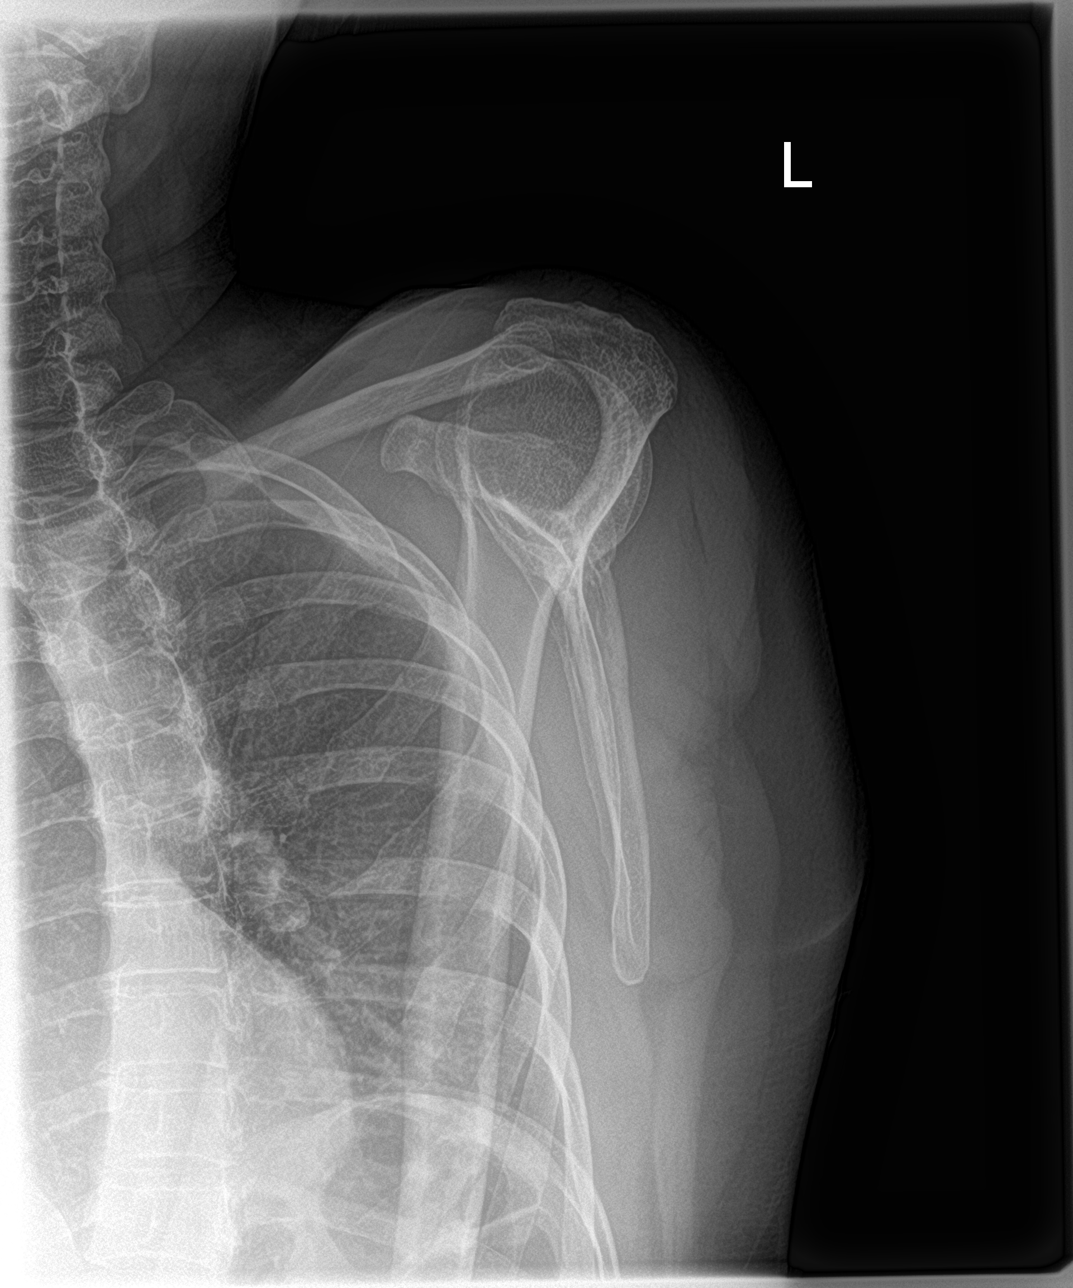

[shoulder axillary]
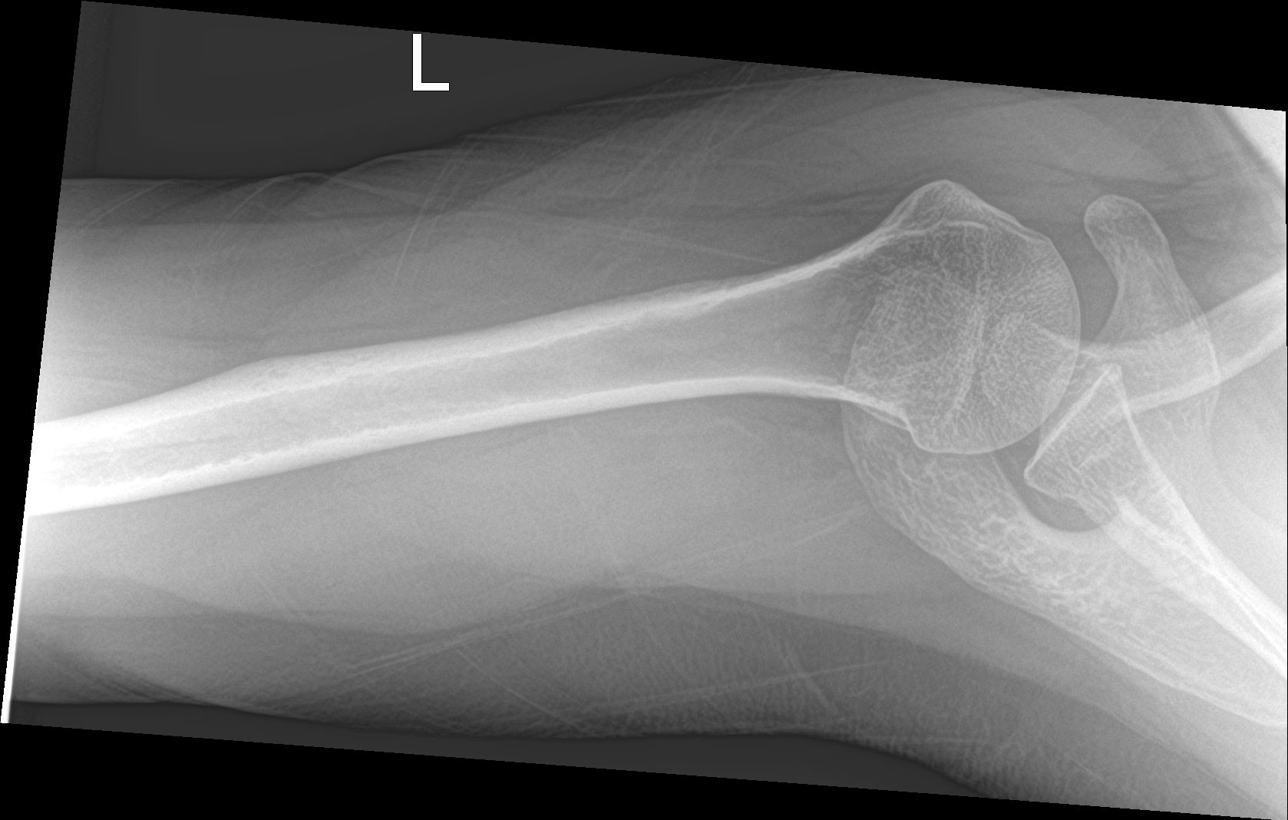

[shoulder ap neutral]
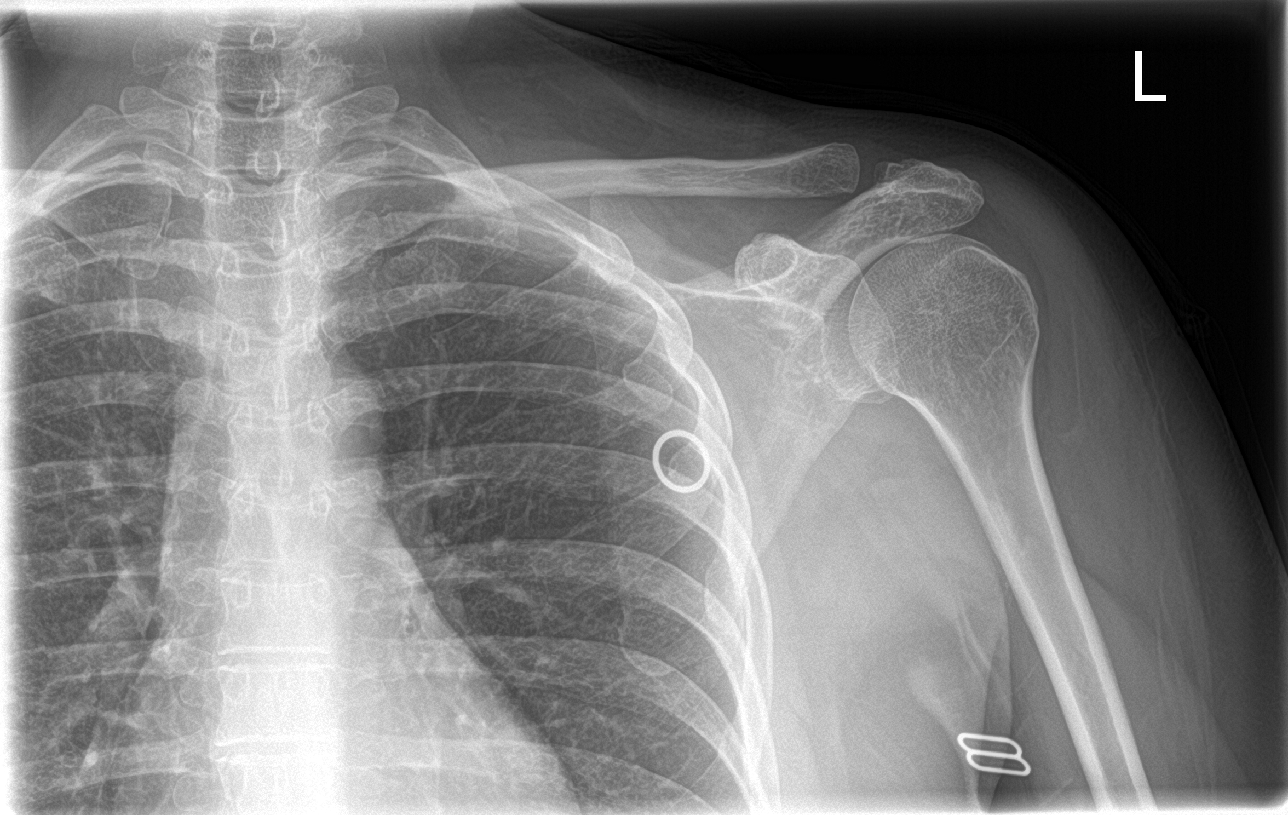

[4 of 4 positions shown; findings below may reference images not displayed]

FINDINGS: There is no evidence of fracture or dislocation. Minimal
degenerative change of the acromioclavicular joint. Soft tissues are
unremarkable. There are left anterior third through fifth rib
fractures have callus formation and appear removed.
IMPRESSION: No acute fracture or subluxation of the left shoulder.

## 2023-08-03 ENCOUNTER — Other Ambulatory Visit: Payer: Self-pay | Admitting: Family Medicine

## 2023-08-03 DIAGNOSIS — Z1231 Encounter for screening mammogram for malignant neoplasm of breast: Secondary | ICD-10-CM

## 2023-09-11 ENCOUNTER — Ambulatory Visit

## 2023-11-10 ENCOUNTER — Ambulatory Visit (INDEPENDENT_AMBULATORY_CARE_PROVIDER_SITE_OTHER): Admitting: Family Medicine

## 2023-11-10 ENCOUNTER — Encounter: Payer: Self-pay | Admitting: Family Medicine

## 2023-11-10 VITALS — BP 92/78 | HR 70 | Ht 60.0 in | Wt 137.5 lb

## 2023-11-10 DIAGNOSIS — L708 Other acne: Secondary | ICD-10-CM | POA: Diagnosis not present

## 2023-11-10 DIAGNOSIS — Z23 Encounter for immunization: Secondary | ICD-10-CM

## 2023-11-10 DIAGNOSIS — Z1211 Encounter for screening for malignant neoplasm of colon: Secondary | ICD-10-CM | POA: Diagnosis not present

## 2023-11-10 DIAGNOSIS — Z0001 Encounter for general adult medical examination with abnormal findings: Secondary | ICD-10-CM | POA: Diagnosis not present

## 2023-11-10 DIAGNOSIS — E785 Hyperlipidemia, unspecified: Secondary | ICD-10-CM | POA: Diagnosis not present

## 2023-11-10 MED ORDER — NORGESTIMATE-ETH ESTRADIOL 0.25-35 MG-MCG PO TABS: 1.0000 | ORAL_TABLET | Freq: Every day | ORAL | 11 refills | Status: AC

## 2023-11-10 NOTE — Assessment & Plan Note (Addendum)
-  repeat lipid panel for yearly monitoring The 10-year ASCVD risk score (Arnett DK, et al., 2019) is: 0.9%

## 2023-11-10 NOTE — Patient Instructions (Signed)
 It was wonderful to see you today!  Today we discussed your overall health, and ordered the following annual screening measures -a lipid panel -cologuard  For your acne, we started a medicine called Sprintec that contains estrogen and progesterone to help regulate your menstrual cycle. If you do not notice improvement in one month, please schedule a follow up appointment.   Please call [phone] with any questions about today's appointment.   If you need any additional refills, please call your pharmacy before calling the office.  Lucie Pinal, DO Family Medicine

## 2023-11-10 NOTE — Progress Notes (Cosign Needed)
    SUBJECTIVE:   Chief compliant/HPI: annual examination  Diana Fletcher is a 48 y.o. who presents today for an annual exam.   History tabs reviewed and updated.   Review of systems form reviewed and notable for cherry spots on the eyelid, she is concerned they are something bad, worsened hormonal acne.   Recently she has started running more consistently, usually 2 miles a day, but over the weekend she ran six miles. She has also been focusing on changing her diet to include more healthy food options that have improved her mood and energy levels.   OBJECTIVE:   There were no vitals taken for this visit.  General: A&O, NAD HEENT: No sign of trauma, EOM grossly intact Cardiac: RRR, no m/r/g Respiratory: CTAB, normal WOB, no w/c/r GI: Soft, NTTP, non-distended  Extremities: NTTP, no peripheral edema.  ASSESSMENT/PLAN:   Assessment & Plan Hyperlipidemia, unspecified hyperlipidemia type -repeat lipid panel for yearly monitoring The 10-year ASCVD risk score (Arnett DK, et al., 2019) is: 0.9%  Colon cancer screening -cologuard ordered Other acne -patient is a non-smoker and has her uterus intact -begin daily OCPs for hormonal regulation -follow up if no improvement in one month.   Annual Examination  See AVS for age appropriate recommendations.   PHQ score reviewed and discussed.  Blood pressure reviewed and at goal.  Asked about intimate partner violence and resources given as appropriate  The patient currently uses tubal ligation for contraception. Folate recommended as appropriate, minimum of 400 mcg per day.   Considered the following items based upon USPSTF recommendations: Diabetes screening: not indicated Lipid panel (nonfasting or fasting) discussed based upon AHA recommendations and ordered.  Consider repeat every 4-6 years.  Reviewed risk factors for latent tuberculosis and not indicated   Discussed family history, BRCA testing not indicated.  Cervical  cancer screening: prior Pap reviewed, repeat due in 2026 Breast cancer screening: not yet indicated Colorectal cancer screening: discussed options, elected for fecal immunohistochemical testing if age 10 or over.   Follow up in 1  year or sooner if indicated.  MyChart Activation: Already signed up  Lucie Pinal, DO [facility]

## 2023-11-11 ENCOUNTER — Ambulatory Visit: Payer: Self-pay | Admitting: Family Medicine

## 2023-11-11 LAB — LIPID PANEL
Chol/HDL Ratio: 5.3 ratio — ABNORMAL HIGH (ref 0.0–4.4)
Cholesterol, Total: 252 mg/dL — ABNORMAL HIGH (ref 100–199)
HDL: 48 mg/dL (ref >39)
LDL Chol Calc (NIH): 178 mg/dL — ABNORMAL HIGH (ref 0–99)
Triglycerides: 140 mg/dL (ref 0–149)
VLDL Cholesterol Cal: 26 mg/dL (ref 5–40)
